# Patient Record
Sex: Male | Born: 1995 | Race: White | Hispanic: Yes | Marital: Single | Smoking: Never smoker
Health system: Southern US, Community
[De-identification: ages and names within clinical notes are randomized; demographics above are authoritative.]

## PROBLEM LIST (undated history)

## (undated) HISTORY — PX: FRACTURE SURGERY: SHX138

## (undated) HISTORY — PX: HERNIA REPAIR: SHX51

## (undated) HISTORY — PX: APPENDECTOMY: SHX54

---
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
## 2020-08-10 ENCOUNTER — Other Ambulatory Visit: Payer: Self-pay

## 2020-08-10 ENCOUNTER — Ambulatory Visit
Admission: EM | Admit: 2020-08-10 | Discharge: 2020-08-10 | Disposition: A | Discharge status: Home / Self Care | Payer: Self-pay | Attending: Internal Medicine | Admitting: Internal Medicine

## 2020-08-10 ENCOUNTER — Ambulatory Visit (INDEPENDENT_AMBULATORY_CARE_PROVIDER_SITE_OTHER): Payer: Self-pay

## 2020-08-10 ENCOUNTER — Encounter: Payer: Self-pay | Admitting: Emergency Medicine

## 2020-08-10 DIAGNOSIS — S62315A Displaced fracture of base of fourth metacarpal bone, left hand, initial encounter for closed fracture: Secondary | ICD-10-CM

## 2020-08-10 DIAGNOSIS — M79641 Pain in right hand: Secondary | ICD-10-CM

## 2020-08-10 MED ORDER — IBUPROFEN 800 MG PO TABS: 800.0000 mg | ORAL_TABLET | Freq: Three times a day (TID) | ORAL | 0 refills | Status: AC
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
## 2020-08-10 NOTE — Discharge Instructions (Signed)
Keep your hand elevated when possible and keep brace on until you see the orthopedist.
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
## 2021-02-17 ENCOUNTER — Other Ambulatory Visit: Payer: Self-pay

## 2021-02-17 ENCOUNTER — Ambulatory Visit
Admission: EM | Admit: 2021-02-17 | Discharge: 2021-02-17 | Disposition: A | Discharge status: Home / Self Care | Payer: Self-pay | Attending: Sports Medicine | Admitting: Sports Medicine

## 2021-02-17 DIAGNOSIS — Z20822 Contact with and (suspected) exposure to covid-19: Secondary | ICD-10-CM | POA: Insufficient documentation

## 2021-02-17 NOTE — ED Triage Notes (Signed)
Pt presents to [facility] for covid testing, post exposure. Pt denies symptoms at this time.

## 2021-02-17 NOTE — Discharge Instructions (Signed)

## 2021-02-18 LAB — SARS CORONAVIRUS 2 (TAT 6-24 HRS): SARS Coronavirus 2: NEGATIVE
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
## 2023-06-10 ENCOUNTER — Emergency Department: Admit: 2023-06-10 | Payer: Self-pay | Admitting: Cardiovascular Disease

## 2023-06-10 SURGERY — CORONARY/GRAFT ACUTE MI REVASCULARIZATION
Anesthesia: Moderate Sedation
